# Patient Record
Sex: Male | Born: 1980 | Hispanic: No | Marital: Married | Smoking: Never smoker
Health system: Southern US, Community
[De-identification: ages and names within clinical notes are randomized; demographics above are authoritative.]

## PROBLEM LIST (undated history)

## (undated) DIAGNOSIS — R011 Cardiac murmur, unspecified: Secondary | ICD-10-CM

## (undated) DIAGNOSIS — I341 Nonrheumatic mitral (valve) prolapse: Secondary | ICD-10-CM

---

## 2019-10-05 ENCOUNTER — Encounter: Payer: Self-pay | Admitting: Emergency Medicine

## 2019-10-05 ENCOUNTER — Emergency Department: Payer: 59

## 2019-10-05 ENCOUNTER — Emergency Department
Admission: EM | Admit: 2019-10-05 | Discharge: 2019-10-05 | Disposition: A | Discharge status: Home / Self Care | Payer: 59 | Attending: Emergency Medicine | Admitting: Emergency Medicine

## 2019-10-05 ENCOUNTER — Other Ambulatory Visit: Payer: Self-pay

## 2019-10-05 DIAGNOSIS — U071 COVID-19: Secondary | ICD-10-CM | POA: Diagnosis not present

## 2019-10-05 DIAGNOSIS — R011 Cardiac murmur, unspecified: Secondary | ICD-10-CM | POA: Insufficient documentation

## 2019-10-05 DIAGNOSIS — R509 Fever, unspecified: Secondary | ICD-10-CM | POA: Insufficient documentation

## 2019-10-05 DIAGNOSIS — R05 Cough: Secondary | ICD-10-CM | POA: Insufficient documentation

## 2019-10-05 DIAGNOSIS — R0602 Shortness of breath: Secondary | ICD-10-CM | POA: Diagnosis present

## 2019-10-05 HISTORY — DX: Nonrheumatic mitral (valve) prolapse: I34.1

## 2019-10-05 HISTORY — DX: Cardiac murmur, unspecified: R01.1

## 2019-10-05 LAB — CBC WITH DIFFERENTIAL/PLATELET
Abs Immature Granulocytes: 0.05 10*3/uL (ref 0.00–0.07)
Basophils Absolute: 0 10*3/uL (ref 0.0–0.1)
Basophils Relative: 0 %
Eosinophils Absolute: 0 10*3/uL (ref 0.0–0.5)
Eosinophils Relative: 0 %
HCT: 51.4 % (ref 39.0–52.0)
Hemoglobin: 16.6 g/dL (ref 13.0–17.0)
Immature Granulocytes: 1 %
Lymphocytes Relative: 8 %
Lymphs Abs: 0.5 10*3/uL — ABNORMAL LOW (ref 0.7–4.0)
MCH: 28.9 pg (ref 26.0–34.0)
MCHC: 32.3 g/dL (ref 30.0–36.0)
MCV: 89.5 fL (ref 80.0–100.0)
Monocytes Absolute: 0.7 10*3/uL (ref 0.1–1.0)
Monocytes Relative: 11 %
Neutro Abs: 4.9 10*3/uL (ref 1.7–7.7)
Neutrophils Relative %: 80 %
Platelets: 179 10*3/uL (ref 150–400)
RBC: 5.74 MIL/uL (ref 4.22–5.81)
RDW: 12.4 % (ref 11.5–15.5)
WBC: 6.1 10*3/uL (ref 4.0–10.5)
nRBC: 0 % (ref 0.0–0.2)

## 2019-10-05 LAB — BASIC METABOLIC PANEL
Anion gap: 10 (ref 5–15)
BUN: 14 mg/dL (ref 6–20)
CO2: 23 mmol/L (ref 22–32)
Calcium: 7.8 mg/dL — ABNORMAL LOW (ref 8.9–10.3)
Chloride: 100 mmol/L (ref 98–111)
Creatinine, Ser: 0.74 mg/dL (ref 0.61–1.24)
GFR calc Af Amer: >60 mL/min (ref >60)
GFR calc non Af Amer: >60 mL/min (ref >60)
Glucose, Bld: 109 mg/dL — ABNORMAL HIGH (ref 70–99)
Potassium: 4 mmol/L (ref 3.5–5.1)
Sodium: 133 mmol/L — ABNORMAL LOW (ref 135–145)

## 2019-10-05 LAB — MAGNESIUM: Magnesium: 2.1 mg/dL (ref 1.7–2.4)

## 2019-10-05 MED ORDER — ACETAMINOPHEN 500 MG PO TABS
1000.0000 mg | ORAL_TABLET | Freq: Once | ORAL | Status: AC
  Administered 2019-10-05: 1000 mg via ORAL
  Filled 2019-10-05: qty 2

## 2019-10-05 MED ORDER — KETOROLAC TROMETHAMINE 30 MG/ML IJ SOLN
15.0000 mg | Freq: Once | INTRAMUSCULAR | Status: AC
  Administered 2019-10-05: 15 mg via INTRAVENOUS
  Filled 2019-10-05: qty 1

## 2019-10-05 MED ORDER — LACTATED RINGERS IV BOLUS
500.0000 mL | Freq: Once | INTRAVENOUS | Status: AC
  Administered 2019-10-05: 500 mL via INTRAVENOUS

## 2019-10-05 MED ORDER — LIDOCAINE VISCOUS HCL 2 % MT SOLN
15.0000 mL | Freq: Once | OROMUCOSAL | Status: AC
  Administered 2019-10-05: 15 mL via OROMUCOSAL
  Filled 2019-10-05: qty 15

## 2019-10-05 NOTE — ED Provider Notes (Signed)
[facility] Emergency Department Provider Note  ____________________________________________   First MD Initiated Contact with Patient 10/05/19 1311     (approximate)  I have reviewed the triage vital signs and the nursing notes.   HISTORY  Chief Complaint Weakness and Shortness of Breath   HPI Alan Key is a 39 y.o. male with a past medical history of asymptomatic mitral regurgitation who presents for assessment approximately 7 to 8 days of fever associated with nonproductive cough and worsening fatigue as well as a positive Covid PCR test obtained earlier this morning in urgent care for further assessment after being referred to the ED from urgent care.  Patient endorses poor p.o. intake and decreased appetite but denies any vomiting, diarrhea, headache, earache, chest pain, abdominal pain, back pain, rash, or recent injuries.  He does endorse a sore throat and some sores in his mouth.  Denies tobacco abuse, illegal drug use, or EtOH abuse.  No prior similar episodes.  No clear alleviating or aggravating factors aside from exertion which aggravates patient's shortness of breath and fatigue and Tylenol and ibuprofen which minimally changes symptoms.         Past Medical History:  Diagnosis Date  . Heart murmur   . Mitral valve prolapse     There are no problems to display for this patient.   History reviewed. No pertinent surgical history.  Prior to Admission medications   Medication Sig Start Date End Date Taking? Authorizing Provider  atorvastatin (LIPITOR) 20 MG tablet Take 20 mg by mouth daily. 07/06/19  Yes [provider]  Multiple Vitamin (MULTI-VITAMIN) tablet Take 1 tablet by mouth daily.   Yes [provider]    Allergies Patient has no known allergies.  No family history on file.  Social History Social History   Tobacco Use  . Smoking status: Never Smoker  Substance Use Topics  . Alcohol use: Never  .  Drug use: Never    Review of Systems Review of Systems  Constitutional: Positive for fever.  HENT: Negative for ear pain.   Eyes: Negative for pain.  Respiratory: Positive for cough and shortness of breath.   Cardiovascular: Negative for chest pain.  Gastrointestinal: Negative for nausea and vomiting.  Genitourinary: Negative for dysuria.  Musculoskeletal: Positive for myalgias ( generalized).  Neurological: Negative for focal weakness and headaches.  Psychiatric/Behavioral: Negative for suicidal ideas.  All other systems reviewed and are negative. ____________________________________________   PHYSICAL EXAM:   VITAL SIGNS: ED Triage Vitals [10/05/19 1306]  Enc Vitals Group     BP (!) 95/51     Pulse Rate 102     Resp (!) 24     Temp 99.8 F (37.7 C)     Temp Source Oral     SpO2 94 %     Weight 140 lb (63.5 kg)     Height 5\' 6"  (1.676 m)     Head Circumference      Peak Flow      Pain Score 8     Pain Loc      Pain Edu?      Excl. in GC?    Vitals:   10/05/19 1400 10/05/19 1412  BP: 109/71 106/68  Pulse: 95 93  Resp: 14 (!) 26  Temp:    SpO2: 95% 95%     Physical Exam Vitals and nursing note reviewed.  Constitutional:      Appearance: He is well-developed and normal weight.  HENT:  Head: Normocephalic and atraumatic.     Right Ear: External ear normal.     Left Ear: External ear normal.     Nose: Nose normal.     Mouth/Throat:     Mouth: Mucous membranes are moist.     Pharynx: Posterior oropharyngeal erythema present.     Comments: Scattered circular ulcers the patient's superior oropharynx and roof of mouth.  No active bleeding.  No uvular deviation or tonsillar exudates.  No evidence of meningismus or induration under the mandible. Eyes:     Extraocular Movements: Extraocular movements intact.     Conjunctiva/sclera: Conjunctivae normal.     Pupils: Pupils are equal, round, and reactive to light.  Cardiovascular:     Rate and Rhythm: Regular  rhythm.     Pulses: Normal pulses.     Heart sounds: Murmur heard.   Abdominal:     General: There is no distension.     Tenderness: There is no abdominal tenderness.  Musculoskeletal:     Cervical back: No rigidity.     Right lower leg: No edema.     Left lower leg: No edema.  Skin:    General: Skin is warm.     Capillary Refill: Capillary refill takes less than 2 seconds.  Neurological:     Mental Status: He is alert and oriented to person, place, and time.  Psychiatric:        Mood and Affect: Mood normal.     ____________________________________________   LABS (all labs ordered are listed, but only abnormal results are displayed)  CBC, BMP, and magnesium unremarkable.  I reviewed patient's labs obtained earlier today at urgent care and confirm patient's positive Covid PCR result. ____________________________________________  EKG  Sinus rhythm with right ventricular hypertrophy, right axis deviation, unremarkable intervals, no clear evidence of acute ischemia or other significant underlying arrhythmia. ____________________________________________  RADIOLOGY  ED MD interpretation: Bilateral opacities most consistent with pneumonia.  No evidence of pneumothorax, significant edema, effusion, or other acute thoracic process.  Official radiology report(s): Patchy bilateral opacities with a basilar predominance, which could reflect pneumonia including COVID-19. ____________________________________________   PROCEDURES  Procedure(s) performed (including Critical Care):  Procedures   ____________________________________________   INITIAL IMPRESSION / ASSESSMENT AND PLAN / ED COURSE  @[email or web address] @     Overall patient history, exam, and work-up is most consistent with COVID-19 pneumonia.  Low suspicion for bacterial etiology at this time.  No evidence of significant electrolyte or metabolic derangement on BMP or significant anemia.  On trial of ambulation  patient's SPO2 did not decrease below 92%.  Ulcers in patient's mouth also likely related to a viral process I have low suspicion for other acute bacterial process, meningitis, or other deep space infection in the head or neck at this time.  Patient was given IV fluids, viscous lidocaine, Toradol, and ibuprofen with some improvement in his myalgias and sore throat on reassessment.  Given stable vital signs without evidence of hypoxia on ambulation I do believe patient is safe for discharge with plan for outpatient follow-up.  Discussed with patient my recommendation that he obtain a pulse ox to monitor his oxygen I discussed strict return precautions including any acute worsening of his symptoms or any evidence of SPO2 saturations below 92% on home pulse ox.  In addition on my exam and reassessment patient noted to be normotensive with maps of 85 and 88.          ____________________________________________   FINAL CLINICAL IMPRESSION(S) / ED DIAGNOSES  Final diagnoses:  COVID-19     ED Discharge Orders    None       Note:  This document was prepared using Dragon voice recognition software and may include unintentional dictation errors.   Gilles Chiquito, MD 10/05/19 [phone]

## 2019-10-05 NOTE — ED Triage Notes (Signed)
Patient reports worsening cough and weakness for the past week. States he has sores in his throat which cause pain with swallowing Was seen at [facility] and tested positive for COVID. Reports fevers at home up to 102.

## 2021-06-06 IMAGING — DX DG CHEST 1V
1 series · 1 of 1 positions shown · non-contrast
Comparison: None.

CLINICAL DATA: COVID positive, cough

EXAM:
CHEST  1 VIEW

[chest ap]
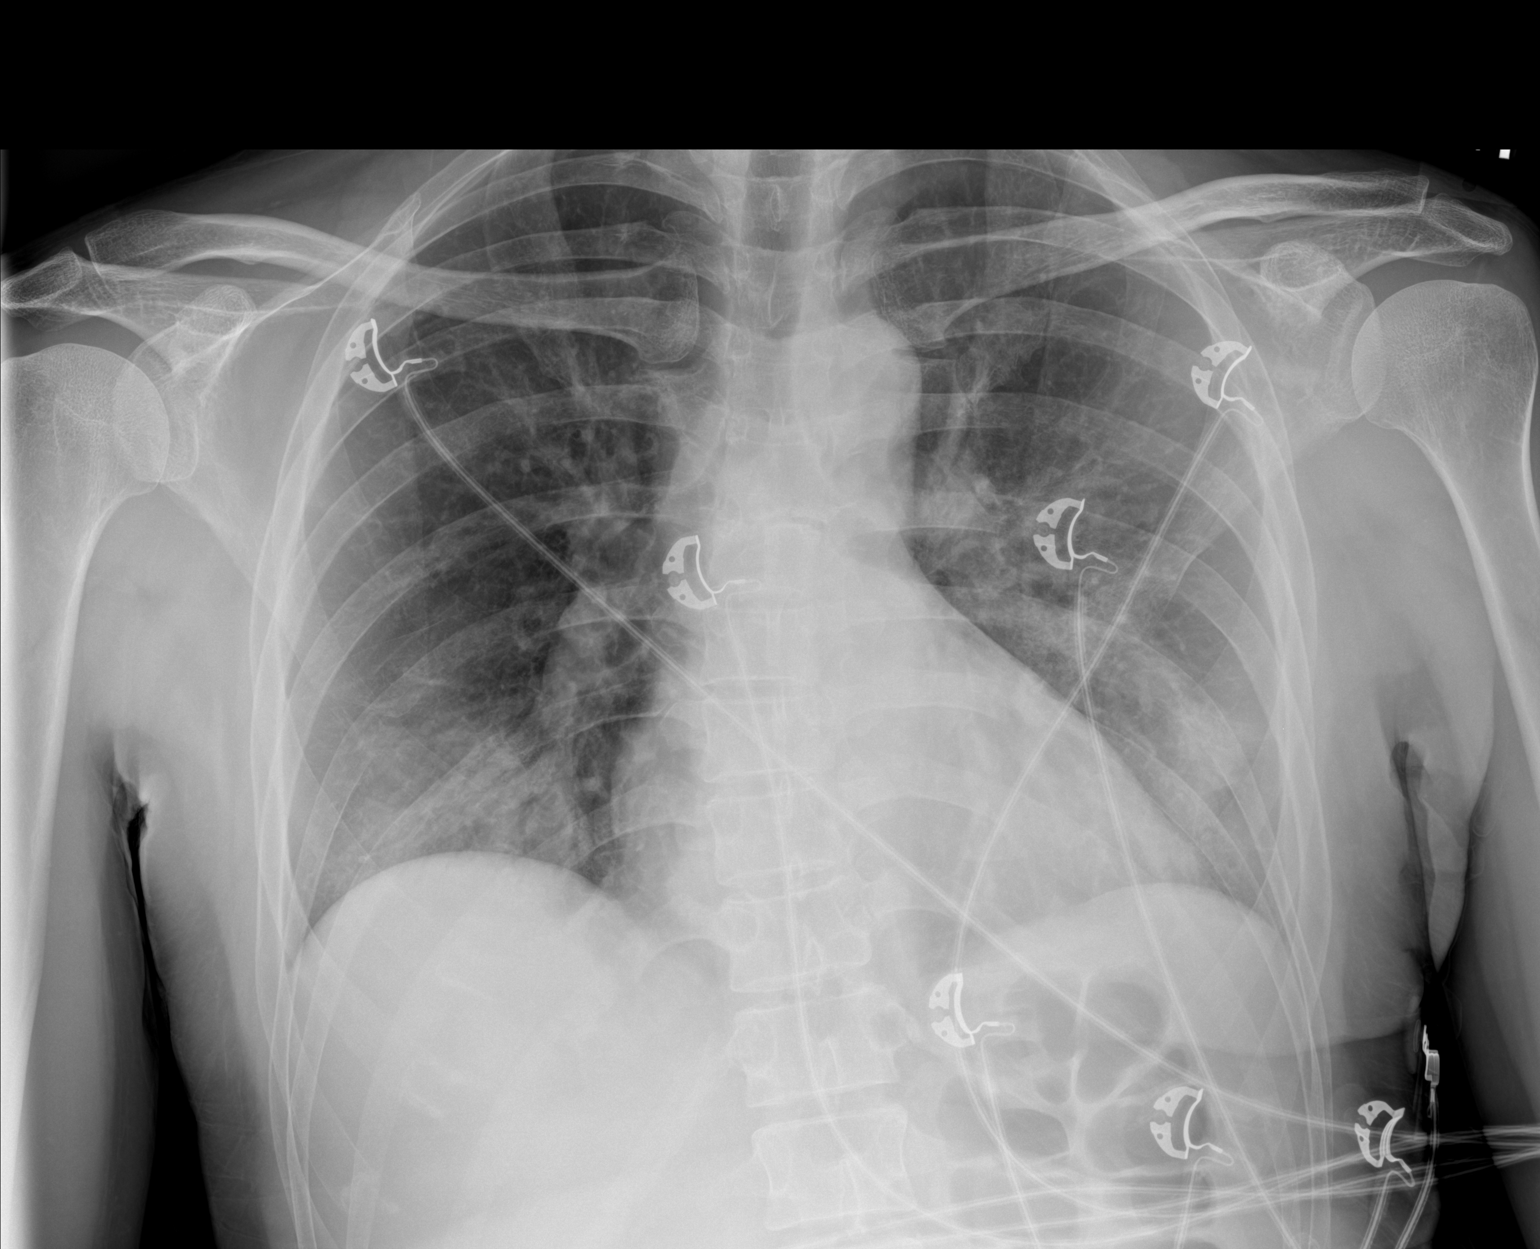

[1 of 1 positions shown; findings below may reference images not displayed]

FINDINGS: Patchy bilateral opacities with a basilar predominance. No pleural
effusion. No pneumothorax. Cardiomediastinal contours are within
normal limits with normal heart size.
IMPRESSION: Patchy bilateral opacities with a basilar predominance, which could
reflect pneumonia including [id number].
